# Patient Record
Sex: Female | Born: 1980 | Race: White | Hispanic: No | Marital: Married | Smoking: Never smoker
Health system: Southern US, Community
[De-identification: ages and names within clinical notes are randomized; demographics above are authoritative.]

## PROBLEM LIST (undated history)

## (undated) DIAGNOSIS — E079 Disorder of thyroid, unspecified: Secondary | ICD-10-CM

---

## 2001-04-11 ENCOUNTER — Emergency Department (HOSPITAL_COMMUNITY): Admission: EM | Admit: 2001-04-11 | Discharge: 2001-04-11 | Payer: Self-pay

## 2002-04-19 ENCOUNTER — Other Ambulatory Visit: Admission: RE | Admit: 2002-04-19 | Discharge: 2002-04-19 | Payer: Self-pay | Admitting: Obstetrics and Gynecology

## 2003-04-24 ENCOUNTER — Other Ambulatory Visit: Admission: RE | Admit: 2003-04-24 | Discharge: 2003-04-24 | Payer: Self-pay | Admitting: Obstetrics and Gynecology

## 2004-09-02 ENCOUNTER — Encounter: Admission: RE | Admit: 2004-09-02 | Discharge: 2004-09-02 | Payer: Self-pay | Admitting: Obstetrics and Gynecology

## 2006-08-04 ENCOUNTER — Ambulatory Visit (HOSPITAL_COMMUNITY): Admission: RE | Admit: 2006-08-04 | Discharge: 2006-08-04 | Payer: Self-pay | Admitting: Obstetrics & Gynecology

## 2006-08-04 ENCOUNTER — Encounter (INDEPENDENT_AMBULATORY_CARE_PROVIDER_SITE_OTHER): Payer: Self-pay | Admitting: Specialist

## 2007-12-30 ENCOUNTER — Inpatient Hospital Stay (HOSPITAL_COMMUNITY): Admission: AD | Admit: 2007-12-30 | Discharge: 2007-12-30 | Payer: Self-pay | Admitting: Obstetrics and Gynecology

## 2008-01-05 ENCOUNTER — Inpatient Hospital Stay (HOSPITAL_COMMUNITY): Admission: AD | Admit: 2008-01-05 | Discharge: 2008-01-05 | Payer: Self-pay | Admitting: Obstetrics

## 2008-01-13 ENCOUNTER — Inpatient Hospital Stay (HOSPITAL_COMMUNITY): Admission: RE | Admit: 2008-01-13 | Discharge: 2008-01-16 | Payer: Self-pay | Admitting: Obstetrics & Gynecology

## 2010-03-01 ENCOUNTER — Inpatient Hospital Stay (HOSPITAL_COMMUNITY)
Admission: AD | Admit: 2010-03-01 | Discharge: 2010-03-02 | Payer: Self-pay | Source: Home / Self Care | Admitting: Obstetrics and Gynecology

## 2010-03-28 ENCOUNTER — Inpatient Hospital Stay (HOSPITAL_COMMUNITY): Admission: AD | Admit: 2010-03-28 | Discharge: 2010-03-28 | Payer: Self-pay | Admitting: Obstetrics & Gynecology

## 2010-03-31 ENCOUNTER — Inpatient Hospital Stay (HOSPITAL_COMMUNITY): Admission: AD | Admit: 2010-03-31 | Discharge: 2010-03-31 | Payer: Self-pay | Admitting: Obstetrics

## 2010-04-01 ENCOUNTER — Inpatient Hospital Stay (HOSPITAL_COMMUNITY): Admission: AD | Admit: 2010-04-01 | Discharge: 2010-04-01 | Payer: Self-pay | Admitting: Obstetrics and Gynecology

## 2010-04-04 ENCOUNTER — Inpatient Hospital Stay (HOSPITAL_COMMUNITY): Admission: AD | Admit: 2010-04-04 | Discharge: 2010-04-06 | Payer: Self-pay | Admitting: Obstetrics

## 2010-09-11 LAB — COMPREHENSIVE METABOLIC PANEL
Albumin: 2.8 g/dL — ABNORMAL LOW (ref 3.5–5.2)
BUN: 3 mg/dL — ABNORMAL LOW (ref 6–23)
Calcium: 9 mg/dL (ref 8.4–10.5)
Glucose, Bld: 77 mg/dL (ref 70–99)
Total Protein: 6.4 g/dL (ref 6.0–8.3)

## 2010-09-11 LAB — URINALYSIS, ROUTINE W REFLEX MICROSCOPIC
Bilirubin Urine: NEGATIVE
Glucose, UA: NEGATIVE mg/dL
Hgb urine dipstick: NEGATIVE
Ketones, ur: NEGATIVE mg/dL
Nitrite: NEGATIVE
Protein, ur: NEGATIVE mg/dL
Specific Gravity, Urine: 1.015 (ref 1.005–1.030)
Urobilinogen, UA: 0.2 mg/dL (ref 0.0–1.0)
pH: 7 (ref 5.0–8.0)

## 2010-09-11 LAB — CBC
HCT: 39 % (ref 36.0–46.0)
HCT: 39.2 % (ref 36.0–46.0)
MCH: 33.8 pg (ref 26.0–34.0)
MCH: 34 pg (ref 26.0–34.0)
MCHC: 34.2 g/dL (ref 30.0–36.0)
MCHC: 34.4 g/dL (ref 30.0–36.0)
MCV: 98.1 fL (ref 78.0–100.0)
MCV: 98.7 fL (ref 78.0–100.0)
MCV: 98.9 fL (ref 78.0–100.0)
Platelets: 188 10*3/uL (ref 150–400)
Platelets: 194 10*3/uL (ref 150–400)
Platelets: 195 10*3/uL (ref 150–400)
RBC: 3.27 MIL/uL — ABNORMAL LOW (ref 3.87–5.11)
RDW: 14.6 % (ref 11.5–15.5)
RDW: 14.8 % (ref 11.5–15.5)
WBC: 13.3 10*3/uL — ABNORMAL HIGH (ref 4.0–10.5)

## 2010-09-11 LAB — T3, FREE: T3, Free: 2.7 pg/mL (ref 2.3–4.2)

## 2010-09-11 LAB — T4, FREE: Free T4: 0.8 ng/dL (ref 0.80–1.80)

## 2010-09-11 LAB — URIC ACID: Uric Acid, Serum: 4.4 mg/dL (ref 2.4–7.0)

## 2010-11-11 NOTE — Op Note (Signed)
[name], [name]             ACCOUNT NO.:  [id number]   MEDICAL RECORD NO.:  [id number]          PATIENT TYPE:  INP   LOCATION:  9126                          FACILITY:  [facility]   PHYSICIAN:  Genia Del, M.D.DATE OF BIRTH:  [birth year]   DATE OF PROCEDURE:  01/14/2008  DATE OF DISCHARGE:                               OPERATIVE REPORT   PREOPERATIVE DIAGNOSES:  A 40 plus weeks' gestation, induction for  postdates, and arrest of descent in second stage.   POSTOPERATIVE DIAGNOSES:  A 40 plus weeks' gestation, induction for  postdates, and arrest of descent in second stage.   PROCEDURE:  Urgent primary low transverse C-section.   SURGEON:  Genia Del, MD.   ASSISTANT:  No assistant.   ANESTHESIOLOGIST:  Angelica Pou, MD   PROCEDURE:  Under epidural anesthesia, the patient is in 15-degree left  decubitus position.  She is prepped with Betadine on the abdominal,  suprapubic, and vulvar areas.  The bladder catheter is already in place,  and the patient is draped as usual.  The level of anesthesia was  verified and is adequate.  We infiltrated the subcutaneous tissue with  Marcaine one-quarter plain 10 mL.  A Pfannenstiel incision is made with  the scalpel.  The adipose tissue is opened transversely with the scalpel  and the electrocautery.  We then opened the aponeurosis transversely  with the electrocautery and the Mayo scissors.  We separated the recti  muscles from the aponeurosis on the midline.  The parietal peritoneum is  opened longitudinally with [employer].  We then inserted the Alexis  retractor.  We opened the visceral peritoneum over the lower uterine  segment transversely with Jen Mow scissors.  The bladder is reclined  downward.  A low transverse hysterotomy was made with a scalpel,  extension on each side with dressing scissors.  Clear amniotic fluid.  The fetus is in cephalic presentation deep in the pelvis.  Birth of the  baby boy at midnight and  22 minutes.  The baby is suctioned.  The cord  is clamped and cut.  The baby is given to the neonatal team.  The weight  is 8 pounds.  Apgars are 9 and 9.  The placenta is removed manually.  Cord-blood banking is done.  We did a uterine revision.  Pitocin IV was  started.  The uterus contracted well.  Ancef 1 g IV is given after cord  clamping.  We then closed the hysterotomy in a first full-thickness  locked running suture of Vicryl 0.  We made a second layer with Vicryl 0  in a mattress suture.  We then verified hemostasis which is adequate at  all levels.  Both tubes and both ovaries are normal to inspection.  We  irrigated and suctioned the abdominopelvic cavity.  We then removed the  Alexis retractor and verified hemostasis on the recti muscles and the  aponeurosis.  It is completed with the electrocautery.  We then closed  the aponeurosis in 2 half running sutures of Vicryl 0.  Hemostasis is  completed on  the adipose tissue, and the skin is  reapproximated with staples.  The  count of instruments and sponges was complete x2.  A compressive  dressing was applied on the incision.  The estimated blood loss was 800  mL.  No complications occurred, and the patient was brought to recovery  room in good stable status.      Genia Del, M.D.  Electronically Signed     ML/MEDQ  D:  01/14/2008  T:  01/14/2008  Job:  [id number]

## 2010-11-14 NOTE — Discharge Summary (Signed)
Kelly Franklin, Kelly Franklin             ACCOUNT NO.:  [id number]   MEDICAL RECORD NO.:  [id number]          PATIENT TYPE:  INP   LOCATION:                                FACILITY:  [facility]   PHYSICIAN:  Genia Del, M.D.DATE OF BIRTH:  1981/01/05   DATE OF ADMISSION:  01/13/2008  DATE OF DISCHARGE:  01/16/2008                               DISCHARGE SUMMARY   ADMISSION DIAGNOSIS:  40+ weeks' gestation, induction for postdates.   DISCHARGE DIAGNOSES:  40+ weeks' gestation, induction for postdates,  arrest of descent in second stage.   PROCEDURE:  Urgent primary low transverse cesarean section.   HOSPITAL COURSE:  The patient had an uneventful urgent primary low  transverse C-section, birth of a baby boy with Apgars of 9 and 9, baby's  weight was 8 pounds.  Estimated blood loss during the surgery was 800  mL.  The patient had a good postop evaluation, remained hemodynamically  stable and afebrile.  She was discharged home on postop day #2 in good  stable status.  Her postop hemoglobin was 9.7.  She was given postop  advice.  Percocet p.r.n. was prescribed, iron sulfate was prescribed and  she will follow up at [facility] in 6 weeks.      Genia Del, M.D.  Electronically Signed     ML/MEDQ  D:  02/25/2008  T:  02/25/2008  Job:  [id number]

## 2010-11-14 NOTE — Op Note (Signed)
Kelly Franklin, Kelly Franklin             ACCOUNT NO.:  [id number]   MEDICAL RECORD NO.:  [id number]          PATIENT TYPE:  AMB   LOCATION:  SDC                           FACILITY:  [facility]   PHYSICIAN:  Genia Del, M.D.DATE OF BIRTH:  Aug 16, 1980   DATE OF PROCEDURE:  08/04/2006  DATE OF DISCHARGE:                               OPERATIVE REPORT   PREOPERATIVE DIAGNOSIS:  Missed abortion, first trimester.   POSTOPERATIVE DIAGNOSIS:  Missed abortion, first trimester.   INTERVENTION:  Dilatation and evacuation.   SURGEON:  Dr. Genia Del.   ASSISTANT:  None.   ANESTHESIOLOGIST:  Burnett Corrente, M.D.   PROCEDURE:  Under MAC analgesia, the patient is in lithotomy position.  She is prepped with Betadine on the suprapubic, vulvar and vaginal  areas.  The bladder is catheterized and the patient is draped as usual.  The vaginal exam reveals an anteverted uterus about 8-9 cm.  No adnexal  mass.  The cervix is long and closed.  No vaginal bleeding.  The  speculum is introduced in the vagina.  A paracervical block is done with  Xylocaine 1% 20 mL total at 4 and 8 o'clock.  We put a tenaculum on the  anterior lip of the cervix and proceed with hysterometry which is at 8  cm.  We then dilate the cervix up to Hegar dilator #31 without  difficulty.  We used a curved #9 suction curette.  The intrauterine  cavity is evacuated by suction.  Products of conception are sent to  pathology.  We then use a sharp curette to systematically curettage the  intrauterine cavity on all surfaces.  We then go back with the suction  curette to assure complete emptying of the cavity including blood clots.  The uterus contracts well.  Hemostasis is adequate.  The tenaculum is  removed, the speculum was removed, the vaginal exam after is normal.  The estimated blood loss was 50 mL.  No complications occurred and the  patient was brought to recovery room in good stable status.  Her blood  group is O positive.   A dose of Flagyl 500 mg IV was given at induction.      Genia Del, M.D.  Electronically Signed     ML/MEDQ  D:  08/04/2006  T:  08/04/2006  Job:  [id number]

## 2011-03-27 LAB — CBC
HCT: 36.3
Hemoglobin: 12.2
Platelets: 178
Platelets: 196
RBC: 3.8 — ABNORMAL LOW
RDW: 15.4
RDW: 15.5
WBC: 11.4 — ABNORMAL HIGH
WBC: 15.3 — ABNORMAL HIGH

## 2011-03-27 LAB — CCBB MATERNAL DONOR DRAW

## 2011-03-27 LAB — RPR: RPR Ser Ql: NONREACTIVE

## 2015-09-11 ENCOUNTER — Other Ambulatory Visit: Payer: Self-pay | Admitting: Obstetrics & Gynecology

## 2015-09-11 DIAGNOSIS — N644 Mastodynia: Secondary | ICD-10-CM

## 2015-09-18 ENCOUNTER — Ambulatory Visit
Admission: RE | Admit: 2015-09-18 | Discharge: 2015-09-18 | Disposition: A | Discharge status: Home / Self Care | Payer: No Typology Code available for payment source | Source: Ambulatory Visit | Attending: Obstetrics & Gynecology | Admitting: Obstetrics & Gynecology

## 2015-09-18 DIAGNOSIS — N644 Mastodynia: Secondary | ICD-10-CM

## 2017-01-27 ENCOUNTER — Other Ambulatory Visit: Payer: Self-pay | Admitting: Endocrinology

## 2017-01-27 DIAGNOSIS — Z808 Family history of malignant neoplasm of other organs or systems: Secondary | ICD-10-CM

## 2018-02-01 ENCOUNTER — Other Ambulatory Visit: Payer: Self-pay | Admitting: Endocrinology

## 2018-02-01 DIAGNOSIS — Z808 Family history of malignant neoplasm of other organs or systems: Secondary | ICD-10-CM

## 2018-02-01 DIAGNOSIS — E039 Hypothyroidism, unspecified: Secondary | ICD-10-CM

## 2018-02-11 ENCOUNTER — Ambulatory Visit
Admission: RE | Admit: 2018-02-11 | Discharge: 2018-02-11 | Disposition: A | Discharge status: Home / Self Care | Payer: BC Managed Care – PPO | Source: Ambulatory Visit | Attending: Endocrinology | Admitting: Endocrinology

## 2018-02-11 DIAGNOSIS — E039 Hypothyroidism, unspecified: Secondary | ICD-10-CM

## 2018-02-11 DIAGNOSIS — Z808 Family history of malignant neoplasm of other organs or systems: Secondary | ICD-10-CM

## 2018-12-06 ENCOUNTER — Other Ambulatory Visit: Payer: Self-pay | Admitting: Obstetrics & Gynecology

## 2018-12-06 DIAGNOSIS — N6320 Unspecified lump in the left breast, unspecified quadrant: Secondary | ICD-10-CM

## 2018-12-15 ENCOUNTER — Ambulatory Visit
Admission: RE | Admit: 2018-12-15 | Discharge: 2018-12-15 | Disposition: A | Discharge status: Home / Self Care | Payer: BC Managed Care – PPO | Source: Ambulatory Visit | Attending: Obstetrics & Gynecology | Admitting: Obstetrics & Gynecology

## 2018-12-15 ENCOUNTER — Other Ambulatory Visit: Payer: Self-pay

## 2018-12-15 ENCOUNTER — Other Ambulatory Visit: Payer: Self-pay | Admitting: Obstetrics & Gynecology

## 2018-12-15 DIAGNOSIS — N6320 Unspecified lump in the left breast, unspecified quadrant: Secondary | ICD-10-CM

## 2021-07-03 ENCOUNTER — Ambulatory Visit (INDEPENDENT_AMBULATORY_CARE_PROVIDER_SITE_OTHER): Payer: Worker's Compensation

## 2021-07-03 ENCOUNTER — Other Ambulatory Visit: Payer: Self-pay

## 2021-07-03 ENCOUNTER — Ambulatory Visit (HOSPITAL_COMMUNITY)
Admission: EM | Admit: 2021-07-03 | Discharge: 2021-07-03 | Disposition: A | Discharge status: Home / Self Care | Payer: Worker's Compensation | Attending: Emergency Medicine | Admitting: Emergency Medicine

## 2021-07-03 ENCOUNTER — Encounter (HOSPITAL_COMMUNITY): Payer: Self-pay | Admitting: Emergency Medicine

## 2021-07-03 DIAGNOSIS — M25571 Pain in right ankle and joints of right foot: Secondary | ICD-10-CM

## 2021-07-03 DIAGNOSIS — S93401A Sprain of unspecified ligament of right ankle, initial encounter: Secondary | ICD-10-CM | POA: Diagnosis not present

## 2021-07-03 DIAGNOSIS — M79671 Pain in right foot: Secondary | ICD-10-CM | POA: Diagnosis not present

## 2021-07-03 DIAGNOSIS — S93601A Unspecified sprain of right foot, initial encounter: Secondary | ICD-10-CM

## 2021-07-03 HISTORY — DX: Disorder of thyroid, unspecified: E07.9

## 2021-07-03 MED ORDER — IBUPROFEN 800 MG PO TABS
ORAL_TABLET | ORAL | Status: AC
  Filled 2021-07-03: qty 1

## 2021-07-03 MED ORDER — ACETAMINOPHEN 325 MG PO TABS
975.0000 mg | ORAL_TABLET | Freq: Once | ORAL | Status: AC
  Administered 2021-07-03: 975 mg via ORAL

## 2021-07-03 MED ORDER — IBUPROFEN 600 MG PO TABS: 600.0000 mg | ORAL_TABLET | Freq: Four times a day (QID) | ORAL | 0 refills | Status: AC | PRN

## 2021-07-03 MED ORDER — IBUPROFEN 800 MG PO TABS
800.0000 mg | ORAL_TABLET | Freq: Once | ORAL | Status: AC
  Administered 2021-07-03: 800 mg via ORAL

## 2021-07-03 MED ORDER — ACETAMINOPHEN 325 MG PO TABS
ORAL_TABLET | ORAL | Status: AC
  Filled 2021-07-03: qty 3

## 2021-07-03 NOTE — ED Triage Notes (Addendum)
Patient rolled right ankle today and felt and heard a pop.  Having sharp, throbbing pain.  Patient is able to wiggle toes, right pedal pulse is 2 +

## 2021-07-03 NOTE — ED Provider Notes (Signed)
HPI  SUBJECTIVE:  Kelly Franklin is a 41 y.o. female who presents with right lateral ankle pain, swelling after rolling it outward while running earlier today.  States that she heard a "pop" followed by immediate pain and swelling.  She states that she was unable to bear weight on it immediately after, and has not been able to bear weight on it since.  She reports tingling distally, limitation of motion of the ankle.  She has tried ice, elevation, ibuprofen 400 mg without improvement in her symptoms.  Symptoms are worse with trying to weight-bear, holding it in a dependent position or with palpation.  She has never injured this ankle before.  She has a past medical history of hypothyroidism.  LMP: Last week.  Denies the possibility of being pregnant.  This is a workers comp case.   Past Medical History:  Diagnosis Date   Thyroid disease     History reviewed. No pertinent surgical history.  Family History  Problem Relation Age of Onset   Hypertension Mother    Thyroid disease Father    Hypertension Father     Social History   Tobacco Use   Smoking status: Never   Smokeless tobacco: Never  Vaping Use   Vaping Use: Never used  Substance Use Topics   Alcohol use: Yes   Drug use: Never    No current facility-administered medications for this encounter.  Current Outpatient Medications:    ibuprofen (ADVIL) 600 MG tablet, Take 1 tablet (600 mg total) by mouth every 6 (six) hours as needed., Disp: 30 tablet, Rfl: 0   levothyroxine (SYNTHROID) 137 MCG tablet, Take 137 mcg by mouth every morning., Disp: , Rfl:    Pediatric Multivitamins-Fl (MULTIVITAMINS/FL PO), See admin instructions., Disp: , Rfl:   Allergies  Allergen Reactions   Minocycline Rash   Sulphamethoxydiazine Rash     ROS  As noted in HPI.   Physical Exam  BP 127/80 (BP Location: Left Arm)    Pulse 82    Temp 98.3 F (36.8 C) (Oral)    Resp 18    LMP 06/23/2021    SpO2 98%   Constitutional: Well  developed, well nourished, no acute distress Eyes:  EOMI, conjunctiva normal bilaterally HENT: Normocephalic, atraumatic,mucus membranes moist Respiratory: Normal inspiratory effort Cardiovascular: Normal rate GI: nondistended skin: No rash, skin intact Musculoskeletal: Right ankle: Soft tissue swelling laterally, Proximal fibula NT, Distal fibula tender, Medial malleolus tender,  Deltoid ligaments tender,  ATFL tender, calcaneofibular ligament  tender, posterior tablofibular ligament NT,  Achilles NT, calcaneus NT,  Proximal 5th metatarsal tender, Midfoot tender, distal NVI with baseline sensation / motor to foot with DP 2+. Pain with dorsiflexion/plantar flexion. Pain with inversion/eversion.  No bruising.  Negative squeeze test.  Slight laxity with ant drawer test. Pt not able to bear weight in dept.   Neurologic: Alert & oriented x 3, no focal neuro deficits Psychiatric: Speech and behavior appropriate   ED Course   Medications  acetaminophen (TYLENOL) tablet 975 mg (975 mg Oral Given 07/03/21 1850)  ibuprofen (ADVIL) tablet 800 mg (800 mg Oral Given 07/03/21 1850)    Orders Placed This Encounter  Procedures   DG Ankle Complete Right    Standing Status:   Standing    Number of Occurrences:   1    Order Specific Question:   Reason for Exam (SYMPTOM  OR DIAGNOSIS REQUIRED)    Answer:   rjolled, popped, painful   DG Foot Complete Right  Standing Status:   Standing    Number of Occurrences:   1    Order Specific Question:   Reason for Exam (SYMPTOM  OR DIAGNOSIS REQUIRED)    Answer:   Fifth metatarsal, midfoot tenderness with ankle sprain r/o fracture    No results found for this or any previous visit (from the past 24 hour(s)). DG Ankle Complete Right  Result Date: 07/03/2021 CLINICAL DATA:  Pain after rolled ankle EXAM: RIGHT ANKLE - COMPLETE 3+ VIEW COMPARISON:  None. FINDINGS: There is no evidence of fracture, dislocation, or joint effusion. There is no evidence of arthropathy  or other focal bone abnormality. Soft tissues are unremarkable. IMPRESSION: Negative. Electronically Signed   By: Corlis Leak M.D.   On: 07/03/2021 18:19   DG Foot Complete Right  Result Date: 07/03/2021 CLINICAL DATA:  Right ankle sprain, fifth metatarsal tenderness EXAM: RIGHT FOOT COMPLETE - 3+ VIEW COMPARISON:  None. FINDINGS: There is no evidence of fracture or dislocation. There is no evidence of arthropathy or other focal bone abnormality. Soft tissues are unremarkable. IMPRESSION: Negative. Electronically Signed   By: Helyn Numbers M.D.   On: 07/03/2021 19:00    ED Clinical Impression  1. Sprain of right ankle, unspecified ligament, initial encounter   2. Sprain of right foot, initial encounter      ED Assessment/Plan  Reviewed imaging independently.  Right ankle: No fracture, dislocation.  Right foot: No fracture, dislocation.  See radiology report for full details.  Patient with a right ankle, foot sprain.  She has crutches here.  Placing an ASO.  Home with ice, elevation, Tylenol/ibuprofen.  Follow-up with occupational health for further evaluation and referral to orthopedics.  Discussed imaging, MDM, treatment plan, and plan for follow-up with patient. patient agrees with plan.   Meds ordered this encounter  Medications   acetaminophen (TYLENOL) tablet 975 mg   ibuprofen (ADVIL) tablet 800 mg   ibuprofen (ADVIL) 600 MG tablet    Sig: Take 1 tablet (600 mg total) by mouth every 6 (six) hours as needed.    Dispense:  30 tablet    Refill:  0      *This clinic note was created using Scientist, clinical (histocompatibility and immunogenetics). Therefore, there may be occasional mistakes despite careful proofreading.  ?    Domenick Gong, MD 07/04/21 [phone]

## 2021-07-03 NOTE — Discharge Instructions (Addendum)
Ankle, foot x-rays are normal.  You have sprained your foot and ankle.  Wear the ASO at all times for the first few weeks.  Use the crutches.  Ice, elevation, 600 mg of ibuprofen combined with 1000 mg of Tylenol 3-4 times a day as needed for pain.  Please follow-up with occupational health for further evaluation and possible referral to orthopedics

## 2023-01-26 ENCOUNTER — Other Ambulatory Visit: Payer: Self-pay | Admitting: Endocrinology

## 2023-01-26 DIAGNOSIS — Z808 Family history of malignant neoplasm of other organs or systems: Secondary | ICD-10-CM

## 2023-04-02 IMAGING — DX DG ANKLE COMPLETE 3+V*R*
3 series · 3 of 3 positions shown · non-contrast
Comparison: None.

CLINICAL DATA: Pain after rolled ankle

EXAM:
RIGHT ANKLE - COMPLETE 3+ VIEW

[ankle ap]
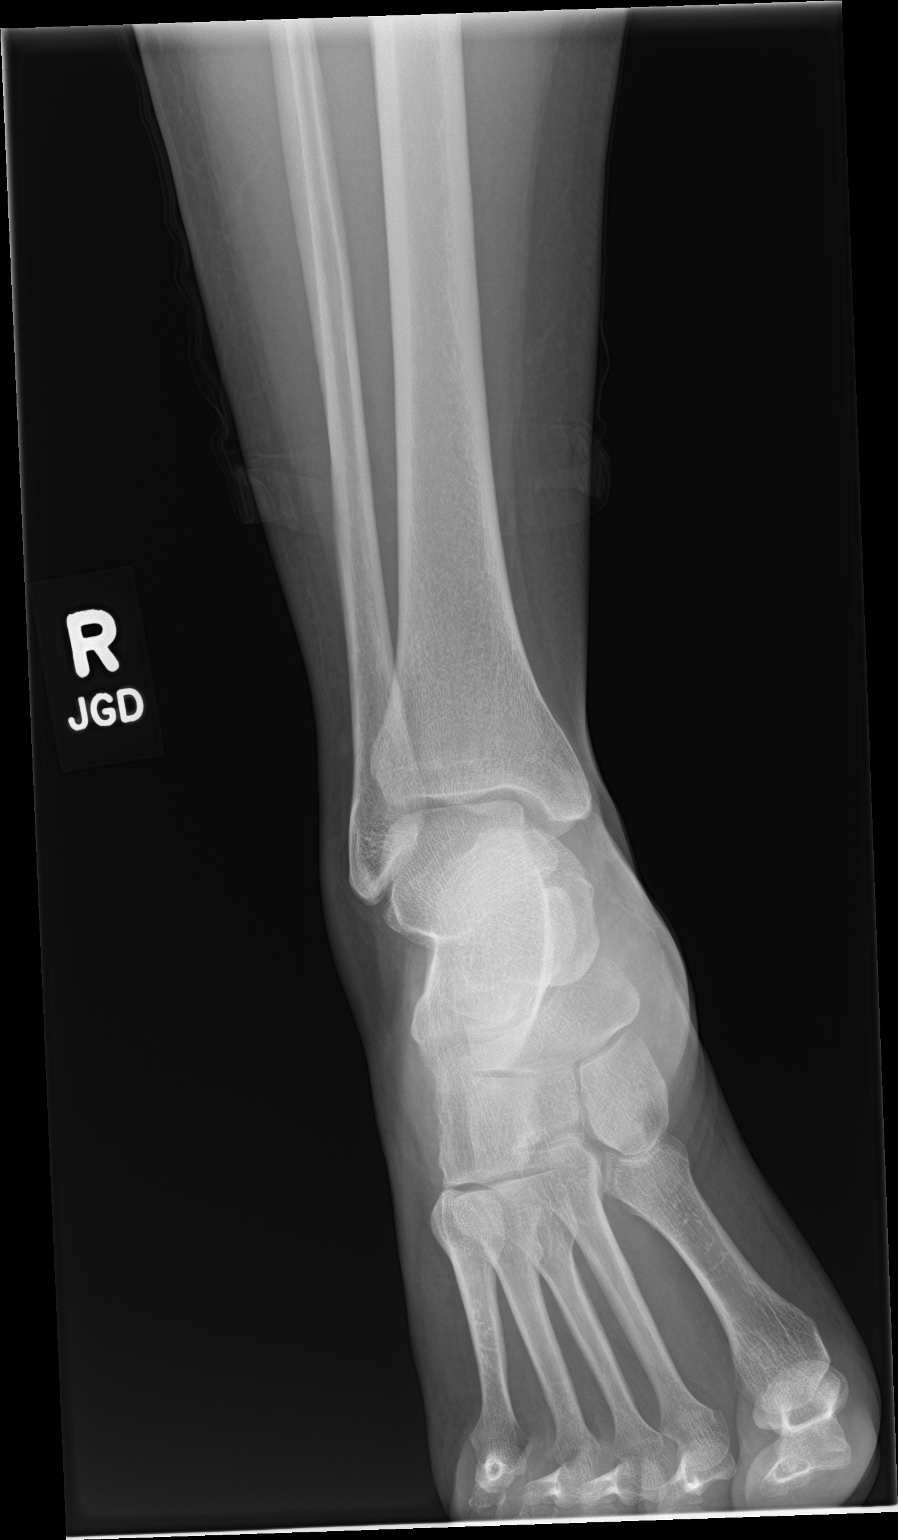

[ankle obl]
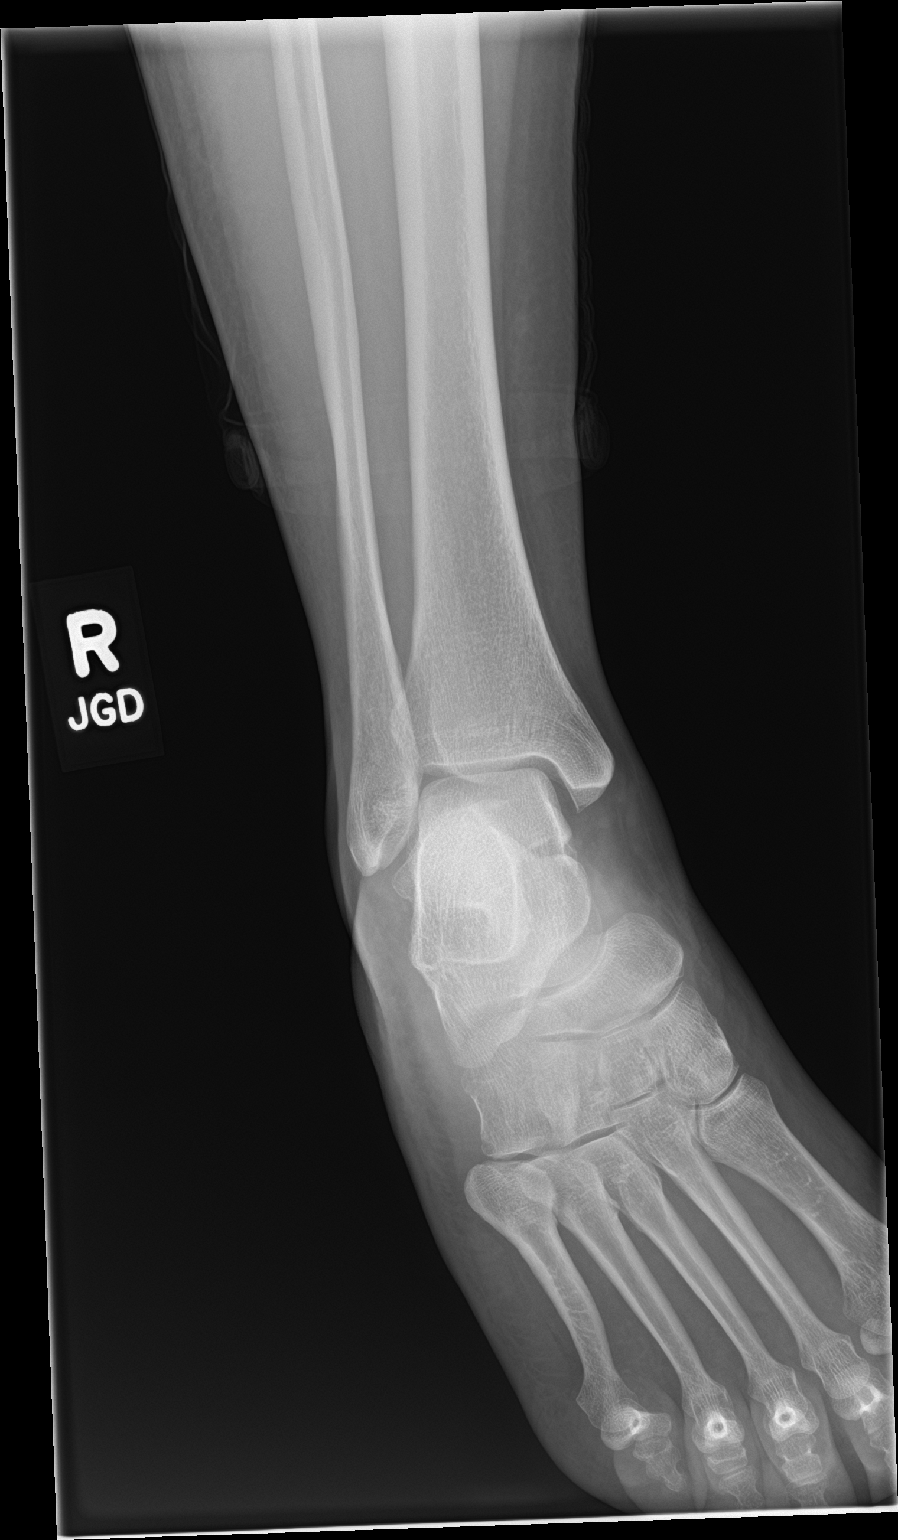

[ankle lat]
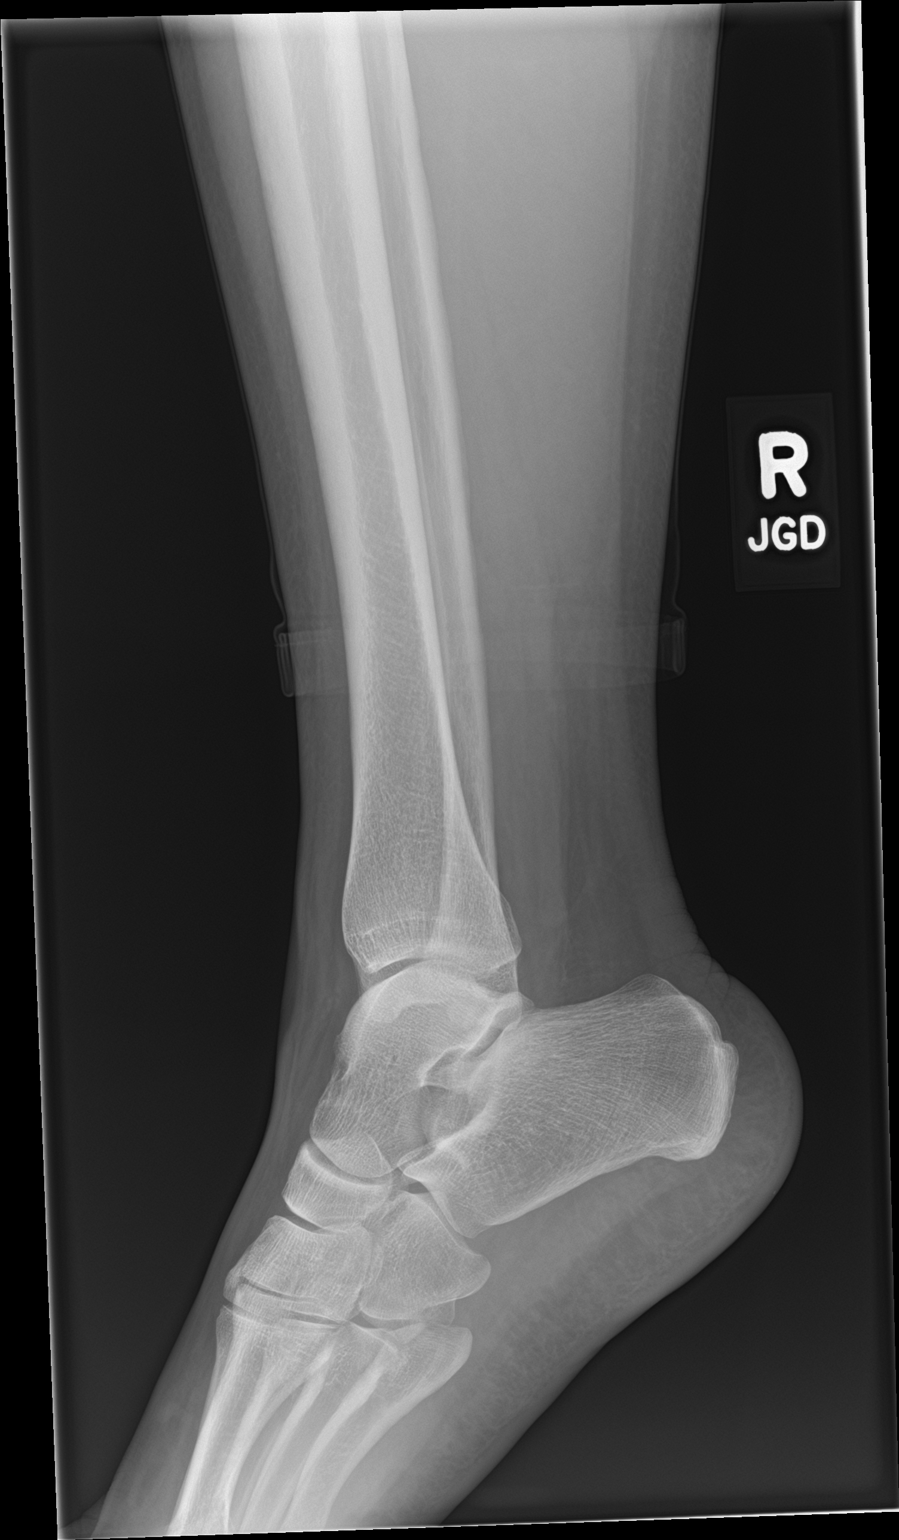

[3 of 3 positions shown; findings below may reference images not displayed]

FINDINGS: There is no evidence of fracture, dislocation, or joint effusion.
There is no evidence of arthropathy or other focal bone abnormality.
Soft tissues are unremarkable.
IMPRESSION: Negative.
